# Patient Record
Sex: Female | Born: 2000 | Race: White | Hispanic: No | Marital: Single | Smoking: Never smoker
Health system: Southern US, Community
[De-identification: ages and names within clinical notes are randomized; demographics above are authoritative.]

## PROBLEM LIST (undated history)

## (undated) DIAGNOSIS — R55 Syncope and collapse: Secondary | ICD-10-CM

## (undated) DIAGNOSIS — K5281 Eosinophilic gastritis or gastroenteritis: Secondary | ICD-10-CM

## (undated) DIAGNOSIS — B9681 Helicobacter pylori [H. pylori] as the cause of diseases classified elsewhere: Secondary | ICD-10-CM

## (undated) HISTORY — DX: Syncope and collapse: R55

---
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
## 2020-01-01 ENCOUNTER — Ambulatory Visit: Payer: PRIVATE HEALTH INSURANCE

## 2020-01-09 ENCOUNTER — Ambulatory Visit: Payer: PRIVATE HEALTH INSURANCE

## 2020-04-22 ENCOUNTER — Ambulatory Visit
Admission: RE | Admit: 2020-04-22 | Discharge: 2020-04-22 | Disposition: A | Discharge status: Home / Self Care | Payer: PRIVATE HEALTH INSURANCE | Source: Ambulatory Visit | Attending: Pediatrics | Admitting: Pediatrics

## 2020-04-22 ENCOUNTER — Other Ambulatory Visit: Payer: Self-pay | Admitting: Pediatrics

## 2020-04-22 DIAGNOSIS — S99911A Unspecified injury of right ankle, initial encounter: Secondary | ICD-10-CM

## 2020-04-27 IMAGING — CR DG CHEST 2V
1 series · 2 of 2 positions shown · non-contrast
Comparison: 12/13/2010

CLINICAL DATA: Chest pain

EXAM:
CHEST - 2 VIEW

[Series 1: dg chest 2 view · 0.14mm/px · 2 of 2 slices shown]
[im 1/2]
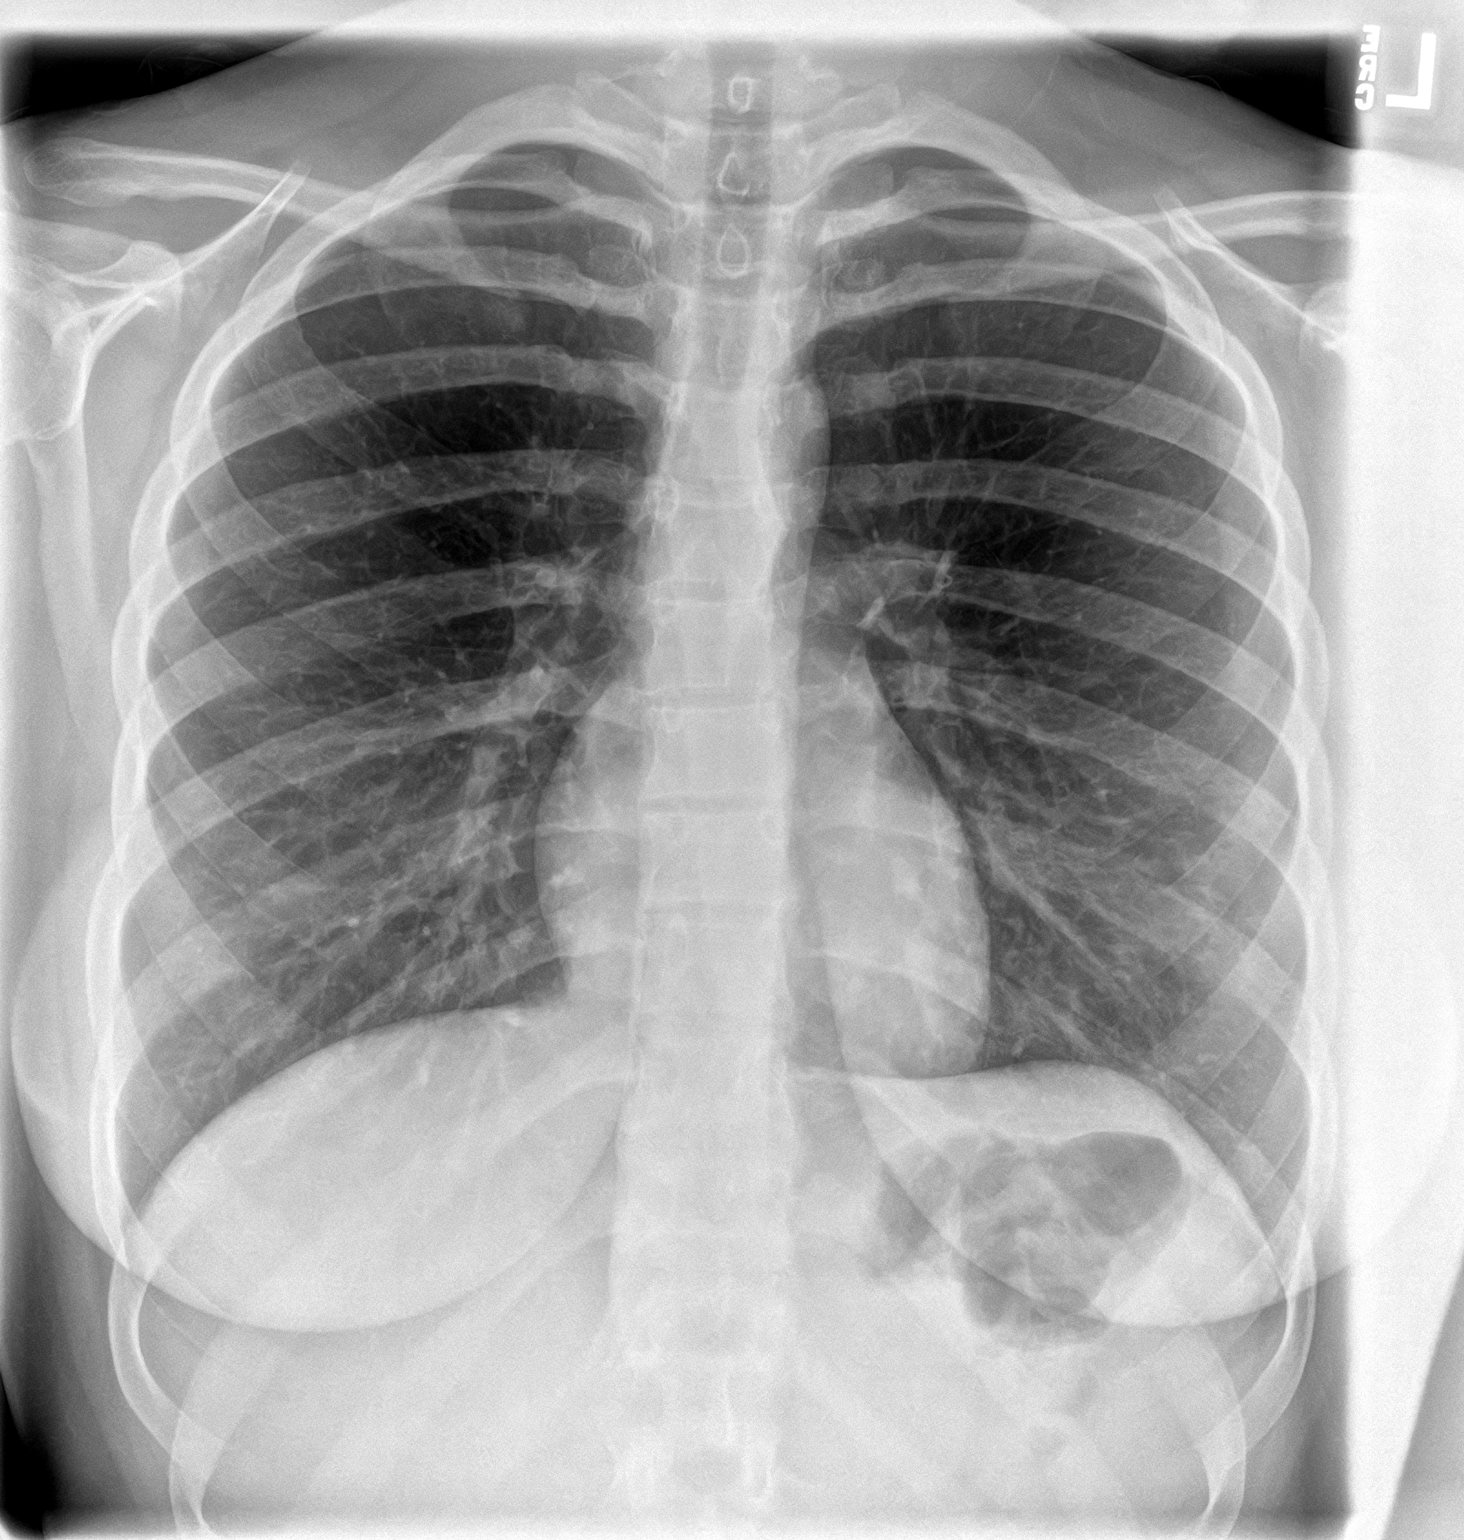
[im 2/2]
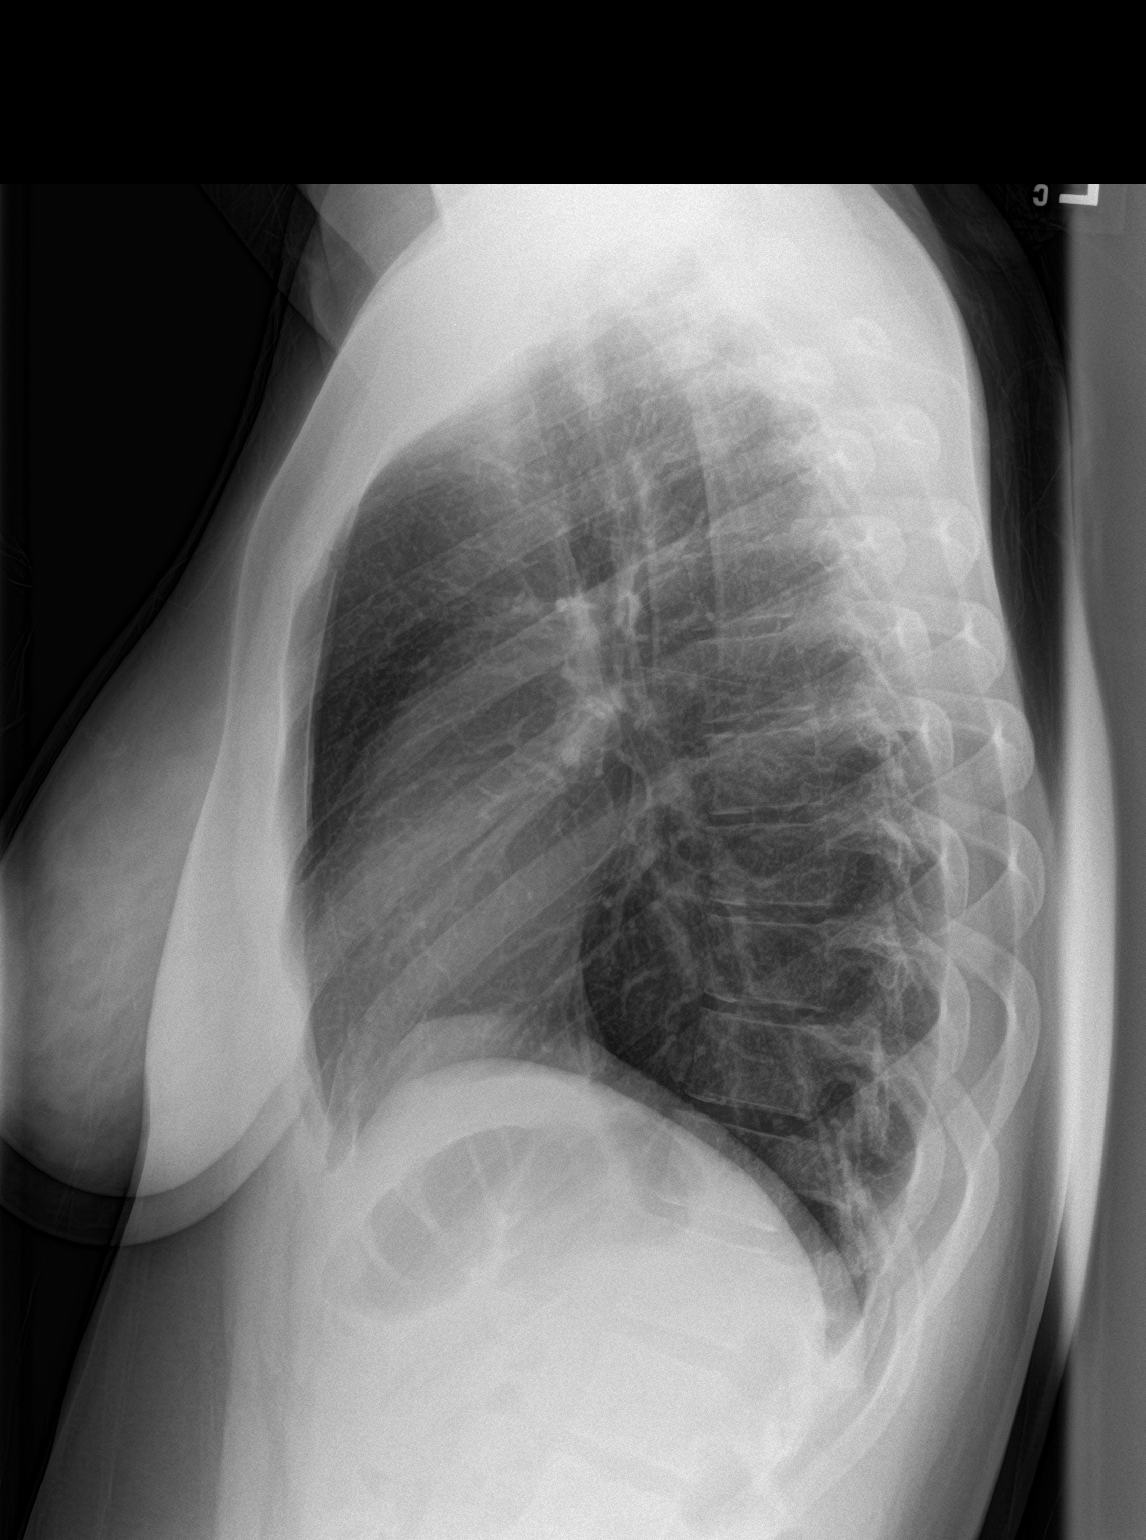

[2 of 2 positions shown; findings below may reference images not displayed]

FINDINGS: The heart size and mediastinal contours are within normal limits.
Both lungs are clear. The visualized skeletal structures are
unremarkable.
IMPRESSION: No active cardiopulmonary disease.

## 2020-09-13 NOTE — Telephone Encounter (Signed)
A user error has taken place: encounter opened in error, closed for administrative reasons.

## 2020-12-11 ENCOUNTER — Other Ambulatory Visit: Payer: Self-pay

## 2020-12-11 DIAGNOSIS — U071 COVID-19: Secondary | ICD-10-CM | POA: Insufficient documentation

## 2020-12-11 DIAGNOSIS — N390 Urinary tract infection, site not specified: Secondary | ICD-10-CM | POA: Insufficient documentation

## 2020-12-11 DIAGNOSIS — E869 Volume depletion, unspecified: Secondary | ICD-10-CM | POA: Insufficient documentation

## 2020-12-11 DIAGNOSIS — Z28310 Unvaccinated for covid-19: Secondary | ICD-10-CM | POA: Insufficient documentation

## 2020-12-11 DIAGNOSIS — B9689 Other specified bacterial agents as the cause of diseases classified elsewhere: Secondary | ICD-10-CM | POA: Insufficient documentation

## 2020-12-11 LAB — BASIC METABOLIC PANEL
Anion gap: 7 (ref 5–15)
BUN: 8 mg/dL (ref 6–20)
CO2: 29 mmol/L (ref 22–32)
Calcium: 9.2 mg/dL (ref 8.9–10.3)
Chloride: 103 mmol/L (ref 98–111)
Creatinine, Ser: 0.76 mg/dL (ref 0.44–1.00)
GFR, Estimated: >60 mL/min (ref >60)
Glucose, Bld: 70 mg/dL (ref 70–99)
Potassium: 3.6 mmol/L (ref 3.5–5.1)
Sodium: 139 mmol/L (ref 135–145)

## 2020-12-11 LAB — URINALYSIS, COMPLETE (UACMP) WITH MICROSCOPIC
Bilirubin Urine: NEGATIVE
Glucose, UA: NEGATIVE mg/dL
Hgb urine dipstick: NEGATIVE
Ketones, ur: NEGATIVE mg/dL
Nitrite: NEGATIVE
Protein, ur: NEGATIVE mg/dL
Specific Gravity, Urine: 1.017 (ref 1.005–1.030)
pH: 8 (ref 5.0–8.0)

## 2020-12-11 LAB — CBC
HCT: 39.6 % (ref 36.0–46.0)
Hemoglobin: 13.7 g/dL (ref 12.0–15.0)
MCH: 30.2 pg (ref 26.0–34.0)
MCHC: 34.6 g/dL (ref 30.0–36.0)
MCV: 87.2 fL (ref 80.0–100.0)
Platelets: 319 10*3/uL (ref 150–400)
RBC: 4.54 MIL/uL (ref 3.87–5.11)
RDW: 12 % (ref 11.5–15.5)
WBC: 5 10*3/uL (ref 4.0–10.5)
nRBC: 0 % (ref 0.0–0.2)

## 2020-12-11 LAB — POC URINE PREG, ED: Preg Test, Ur: NEGATIVE

## 2020-12-11 NOTE — ED Triage Notes (Signed)
Pt to ER via POV with complaints of generalized weakness. Reports she has been sick over the last week with nausea/ vomiting and has been unable to keep anything down. Reports a 7-8lbs weight loss this week. Today she reports that she felt like she was going to pass out. Reports still feeling lightheaded.   Denies abdominal pain. No urinary symptoms.

## 2020-12-12 ENCOUNTER — Emergency Department
Admission: EM | Admit: 2020-12-12 | Discharge: 2020-12-12 | Disposition: A | Discharge status: Home / Self Care | Payer: PRIVATE HEALTH INSURANCE | Attending: Emergency Medicine | Admitting: Emergency Medicine

## 2020-12-12 DIAGNOSIS — U071 COVID-19: Secondary | ICD-10-CM

## 2020-12-12 DIAGNOSIS — R112 Nausea with vomiting, unspecified: Secondary | ICD-10-CM

## 2020-12-12 DIAGNOSIS — E869 Volume depletion, unspecified: Secondary | ICD-10-CM

## 2020-12-12 DIAGNOSIS — N390 Urinary tract infection, site not specified: Secondary | ICD-10-CM

## 2020-12-12 LAB — RESP PANEL BY RT-PCR (FLU A&B, COVID) ARPGX2
Influenza A by PCR: NEGATIVE
Influenza B by PCR: NEGATIVE
SARS Coronavirus 2 by RT PCR: POSITIVE — AB

## 2020-12-12 MED ORDER — ONDANSETRON HCL 4 MG/2ML IJ SOLN
4.0000 mg | INTRAMUSCULAR | Status: AC
  Administered 2020-12-12: 4 mg via INTRAVENOUS
  Filled 2020-12-12: qty 2

## 2020-12-12 MED ORDER — ONDANSETRON 4 MG PO TBDP: ORAL_TABLET | ORAL | 0 refills | Status: AC

## 2020-12-12 MED ORDER — LACTATED RINGERS IV BOLUS
1000.0000 mL | Freq: Once | INTRAVENOUS | Status: AC
  Administered 2020-12-12: 1000 mL via INTRAVENOUS

## 2020-12-12 MED ORDER — FOSFOMYCIN TROMETHAMINE 3 G PO PACK
3.0000 g | PACK | Freq: Once | ORAL | Status: AC
  Administered 2020-12-12: 3 g via ORAL
  Filled 2020-12-12: qty 3

## 2020-12-12 NOTE — ED Provider Notes (Signed)
[facility] Emergency Department Provider Note  ____________________________________________   Event Date/Time   First MD Initiated Contact with Patient 12/12/20 0250     (approximate)  I have reviewed the triage vital signs and the nursing notes.   HISTORY  Chief Complaint Weakness    HPI Nicole Glass is a 20 y.o. female with history as listed below which notably includes eosinophilic gastroenteritis and peptic ulcer surgery disease with H. pylori.  She presents for about a week of general malaise, several episodes of nausea and vomiting, decreased appetite.  The nausea and vomiting has improved over the last couple of days but she still does not have much of an appetite.  She denies abdominal pain.  She reports that she has lost some weight this week, perhaps as much is 7 or 8 pounds.  By today she sometimes feels lightheaded like she might pass out when she stands up and moves around.  She describes the symptoms as severe at times and nothing in particular makes them better, standing up and moving around makes them worse.  She reports that she is not vaccinated against COVID-19.  She denies headache, sore throat, chest pain, shortness of breath, cough, abdominal pain, and dysuria.      Past Medical History:  Diagnosis Date   Eosinophilic gastroenteritis    Peptic ulcer associated with Helicobacter pylori infection     There are no problems to display for this patient.   History reviewed. No pertinent surgical history.  Prior to Admission medications   Medication Sig Start Date End Date Taking? Authorizing Provider  ondansetron (ZOFRAN ODT) 4 MG disintegrating tablet Allow 1-2 tablets to dissolve in your mouth every 8 hours as needed for nausea/vomiting 12/12/20  Yes Loleta Rose, MD  omeprazole (PRILOSEC OTC) 20 MG tablet Take 1 tablet (20 mg total) by mouth daily. 05/19/18 05/19/19  Loleta Rose, MD  promethazine (PHENERGAN) 12.5 MG tablet Take 1  tablet (12.5 mg total) by mouth every 6 (six) hours as needed for nausea or vomiting. 05/22/18   Willy Eddy, MD  sucralfate (CARAFATE) 1 g tablet Take 1 tablet (1 g total) by mouth 4 (four) times daily as needed (for abdominal discomfort, nausea, and/or vomiting). 05/19/18   Loleta Rose, MD    Allergies Augmentin [amoxicillin-pot clavulanate] and Omnicef [cefdinir]  No family history on file.  Social History Social History   Tobacco Use   Smoking status: Never   Smokeless tobacco: Never  Substance Use Topics   Alcohol use: Never   Drug use: Never    Review of Systems Constitutional: No fever/chills.  General malaise and fatigue. Eyes: No visual changes. ENT: No sore throat. Cardiovascular: Denies chest pain. Respiratory: Denies shortness of breath. Gastrointestinal: Multiple episodes of nausea and vomiting recently but not for the last couple of days.  Decreased appetite.  No abdominal pain. Genitourinary: Negative for dysuria. Musculoskeletal: Negative for neck pain.  Negative for back pain. Integumentary: Negative for rash. Neurological: Negative for headaches, focal weakness or numbness.   ____________________________________________   PHYSICAL EXAM:  VITAL SIGNS: ED Triage Vitals  Enc Vitals Group     BP 12/11/20 1827 111/78     Pulse Rate 12/11/20 1827 99     Resp 12/11/20 1827 18     Temp 12/11/20 1827 98 F (36.7 C)     Temp Source 12/11/20 1827 Oral     SpO2 12/11/20 1827 100 %     Weight 12/11/20 1856 59 kg (130 lb)  Height 12/11/20 1856 1.651 m (5\' 5" )     Head Circumference --      Peak Flow --      Pain Score 12/11/20 1856 0     Pain Loc --      Pain Edu? --      Excl. in GC? --     Constitutional: Alert and oriented.  Eyes: Conjunctivae are normal.  Head: Atraumatic. Nose: No congestion/rhinnorhea. Mouth/Throat: Patient is wearing a mask. Neck: No stridor.  No meningeal signs.   Cardiovascular: Normal rate, regular rhythm. Good  peripheral circulation. Respiratory: Normal respiratory effort.  No retractions. Gastrointestinal: Soft and nontender. No distention.  Musculoskeletal: No lower extremity tenderness nor edema. No gross deformities of extremities. Neurologic:  Normal speech and language. No gross focal neurologic deficits are appreciated.  Skin:  Skin is warm, dry and intact. Psychiatric: Mood and affect are normal. Speech and behavior are normal.  ____________________________________________   LABS (all labs ordered are listed, but only abnormal results are displayed)  Labs Reviewed  RESP PANEL BY RT-PCR (FLU A&B, COVID) ARPGX2 - Abnormal; Notable for the following components:      Result Value   SARS Coronavirus 2 by RT PCR POSITIVE (*)    All other components within normal limits  URINALYSIS, COMPLETE (UACMP) WITH MICROSCOPIC - Abnormal; Notable for the following components:   Color, Urine YELLOW (*)    APPearance CLOUDY (*)    Leukocytes,Ua LARGE (*)    Bacteria, UA RARE (*)    All other components within normal limits  BASIC METABOLIC PANEL  CBC  POC URINE PREG, ED  POC URINE PREG, ED  CBG MONITORING, ED   ____________________________________________   INITIAL IMPRESSION / MDM / ASSESSMENT AND PLAN / ED COURSE  As part of my medical decision making, I reviewed the following data within the electronic MEDICAL RECORD NUMBER History obtained from family, Nursing notes reviewed and incorporated, Labs reviewed , Old chart reviewed, and Notes from prior ED visits   Differential diagnosis includes, but is not limited to, nonspecific viral infection, COVID-19, SBO/ileus, nonspecific gastritis, anemia, peptic ulcer disease.  Patient's vital signs are stable and within normal limits with no tachycardia and normal blood pressure.  She has not had any nausea or vomiting recently but has had decreased appetite.  No tenderness to palpation of the abdomen.  Labs are notable for some WBCs in her urine in the  absence of dysuria.  Basic metabolic panel is normal, urine pregnancy test is negative, CBC is normal with no anemia and no leukocytosis.  The absence of abdominal pain and tenderness points away from peptic ulcer disease though nonspecific gastritis is still possible.  Fortunately there is no evidence of an emergent medical condition at this time.  The patient denies dysuria but does have some WBCs and large leukocytes.  I will treat with a one-time dose of fosfomycin as this could theoretically be causing her to be ill and I will send a urine culture for future reference.  She is not on vaccinated for COVID-19 and at the patient and mother's request I am sending a respiratory viral swab.  We will provide Zofran 4 mg IV, lactated Ringer's 1 L IV bolus, and encourage p.o. fluids.  I anticipate discharge and outpatient follow-up.     Clinical Course as of 12/12/20 0455  Thu Dec 12, 2020  0452 SARS Coronavirus 2 by RT PCR(!): POSITIVE Patient is COVID-positive which is consistent with her symptoms.  We will treat  with fosfomycin as previously described for questionable UTI and I updated the patient and her mother about the results.  I gave my usual and customary management recommendations and return precautions and they understand and agree with the plan. [CF]  [phone] Of note, given that the patient has had symptoms for about a week, she is not a candidate for paxlovid. [CF]    Clinical Course User Index [CF] Loleta Rose, MD     ____________________________________________  FINAL CLINICAL IMPRESSION(S) / ED DIAGNOSES  Final diagnoses:  COVID-19  Urinary tract infection without hematuria, site unspecified  Volume depletion  Non-intractable vomiting with nausea, unspecified vomiting type     MEDICATIONS GIVEN DURING THIS VISIT:  Medications  fosfomycin (MONUROL) packet 3 g (has no administration in time range)  ondansetron (ZOFRAN) injection 4 mg (4 mg Intravenous Given 12/12/20 0324)   lactated ringers bolus 1,000 mL (1,000 mLs Intravenous New Bag/Given 12/12/20 0324)     ED Discharge Orders          Ordered    ondansetron (ZOFRAN ODT) 4 MG disintegrating tablet        12/12/20 0453             Note:  This document was prepared using Dragon voice recognition software and may include unintentional dictation errors.   Loleta Rose, MD 12/12/20 [phone]

## 2020-12-12 NOTE — Discharge Instructions (Signed)
As we discussed, we believe your symptoms are caused by COVID-19 because you tested positive today.  The rest of your work-up was reassuring.  Please read through the included information.  Use the prescribed nausea medicine if needed but it is important that he stay hydrated.  Follow-up with your regular doctor.  Return to the emergency department if you develop new or worsening symptoms that concern you.

## 2021-06-18 ENCOUNTER — Emergency Department
Admission: EM | Admit: 2021-06-18 | Discharge: 2021-06-18 | Disposition: A | Discharge status: Home / Self Care | Payer: Medicaid Other | Attending: Emergency Medicine | Admitting: Emergency Medicine

## 2021-06-18 DIAGNOSIS — R55 Syncope and collapse: Secondary | ICD-10-CM | POA: Insufficient documentation

## 2021-06-18 LAB — URINE DRUG SCREEN, QUALITATIVE (ARMC ONLY)
Amphetamines, Ur Screen: NOT DETECTED
Barbiturates, Ur Screen: NOT DETECTED
Benzodiazepine, Ur Scrn: NOT DETECTED
Cannabinoid 50 Ng, Ur ~~LOC~~: NOT DETECTED
Cocaine Metabolite,Ur ~~LOC~~: NOT DETECTED
MDMA (Ecstasy)Ur Screen: NOT DETECTED
Methadone Scn, Ur: NOT DETECTED
Opiate, Ur Screen: NOT DETECTED
Phencyclidine (PCP) Ur S: NOT DETECTED
Tricyclic, Ur Screen: NOT DETECTED

## 2021-06-18 LAB — D-DIMER, QUANTITATIVE: D-Dimer, Quant: <0.27 ug/mL-FEU (ref 0.00–0.50)

## 2021-06-18 LAB — TROPONIN I (HIGH SENSITIVITY): Troponin I (High Sensitivity): <2 ng/L (ref <18)

## 2021-06-18 LAB — BASIC METABOLIC PANEL
Anion gap: 7 (ref 5–15)
BUN: 12 mg/dL (ref 6–20)
CO2: 26 mmol/L (ref 22–32)
Calcium: 9.2 mg/dL (ref 8.9–10.3)
Chloride: 102 mmol/L (ref 98–111)
Creatinine, Ser: 0.62 mg/dL (ref 0.44–1.00)
GFR, Estimated: >60 mL/min (ref >60)
Glucose, Bld: 93 mg/dL (ref 70–99)
Potassium: 3.3 mmol/L — ABNORMAL LOW (ref 3.5–5.1)
Sodium: 135 mmol/L (ref 135–145)

## 2021-06-18 LAB — CBC
HCT: 38.2 % (ref 36.0–46.0)
Hemoglobin: 12.5 g/dL (ref 12.0–15.0)
MCH: 29.3 pg (ref 26.0–34.0)
MCHC: 32.7 g/dL (ref 30.0–36.0)
MCV: 89.5 fL (ref 80.0–100.0)
Platelets: 316 10*3/uL (ref 150–400)
RBC: 4.27 MIL/uL (ref 3.87–5.11)
RDW: 12.8 % (ref 11.5–15.5)
WBC: 8.3 10*3/uL (ref 4.0–10.5)
nRBC: 0 % (ref 0.0–0.2)

## 2021-06-18 LAB — PREGNANCY, URINE: Preg Test, Ur: NEGATIVE

## 2021-06-18 LAB — CBG MONITORING, ED: Glucose-Capillary: 100 mg/dL — ABNORMAL HIGH (ref 70–99)

## 2021-06-18 LAB — ETHANOL: Alcohol, Ethyl (B): <10 mg/dL (ref <10)

## 2021-06-18 NOTE — ED Provider Notes (Signed)
[facility] Provider Note   Event Date/Time   First MD Initiated Contact with Patient 06/18/21 1459     (approximate)  History   Near Syncope  HPI Nicole Glass is a 21 y.o. female with a past medical history of eosinophilic gastroenteritis who presents for reported syncope.  Patient states that she has had multiple syncopal events since presenting to the emergency department today as well as 4 within the past few days.  Patient describes these episodes as feeling very lightheaded when not being able to open her eyes however she denies fully losing consciousness.  Patient states that she is aware of everything when she has these episodes and denies any previous symptoms similar to this.  Patient denies any chest pain, fluttering in the chest, shortness of breath.  Patient does endorse headache and a warm sensation.     Physical Exam   Triage Vital Signs: ED Triage Vitals  Enc Vitals Group     BP 06/18/21 1405 126/85     Pulse Rate 06/18/21 1405 99     Resp 06/18/21 1418 20     Temp 06/18/21 1418 98.7 F (37.1 C)     Temp src --      SpO2 06/18/21 1405 100 %     Weight --      Height 06/18/21 1413 5\' 5"  (1.651 m)     Head Circumference --      Peak Flow --      Pain Score 06/18/21 1411 0     Pain Loc --      Pain Edu? --      Excl. in GC? --     Most recent vital signs: Vitals:   06/18/21 1730 06/18/21 1745  BP:    Pulse: 94 (!) 103  Resp: 19 19  Temp:    SpO2: 100% 100%    General: Awake, oriented x4 CV:  Good peripheral perfusion.  Resp:  Normal effort.  Abd:  No distention.  Other:  Young Caucasian female laying in bed in no distress   ED Results / Procedures / Treatments   Labs (all labs ordered are listed, but only abnormal results are displayed) Labs Reviewed  BASIC METABOLIC PANEL - Abnormal; Notable for the following components:      Result Value   Potassium 3.3 (*)    All other components within normal limits  CBG  MONITORING, ED - Abnormal; Notable for the following components:   Glucose-Capillary 100 (*)    All other components within normal limits  URINE DRUG SCREEN, QUALITATIVE ([facility] ONLY)  CBC  ETHANOL  D-DIMER, QUANTITATIVE  PREGNANCY, URINE  POC URINE PREG, ED  TROPONIN I (HIGH SENSITIVITY)  TROPONIN I (HIGH SENSITIVITY)     EKG ED ECG REPORT I, 08/16/21, the attending physician, personally viewed and interpreted this ECG.  Date: 06/18/2021 EKG Time: 1409 Rate: 90 Rhythm: normal sinus rhythm QRS Axis: normal Intervals: normal ST/T Wave abnormalities: normal Narrative Interpretation: no evidence of acute ischemia  PROCEDURES:  Critical Care performed: No  .1-3 Lead EKG Interpretation Performed by: 08/16/2021, MD Authorized by: Merwyn Katos, MD     Interpretation: normal     ECG rate:  95   ECG rate assessment: normal     Rhythm: sinus rhythm     Ectopy: none     Conduction: normal    MEDICATIONS ORDERED IN ED: Medications - No data to display  IMPRESSION / MDM /  ASSESSMENT AND PLAN / ED COURSE  I reviewed the triage vital signs and the nursing notes.                             The patient is on the cardiac monitor to evaluate for evidence of arrhythmia and/or significant heart rate changes. Patient presents with complaints of syncope/presyncope ED Workup:  CBC, BMP, Troponin, ECG, urine pregnancy, UDS Differential diagnosis includes HF, ICH, seizure, stroke, HOCM, ACS, aortic dissection, malignant arrhythmia, or GI bleed. Findings: No evidence of acute laboratory abnormalities.  Troponin negative x1 EKG: No e/o STEMI. No evidence of Brugadas sign, delta wave, epsilon wave, significantly prolonged QTc, or malignant arrhythmia. CBC showed no evidence of acute abnormalities BMP only showed slight hypokalemia to 3.3 and patient was encouraged to increase potassium containing foods. Disposition: Discharge. Patient is at baseline at this time. Return  precautions expressed and understood in person. Advised follow up with primary care provider or clinic physician in next 24 hours.      FINAL CLINICAL IMPRESSION(S) / ED DIAGNOSES   Final diagnoses:  Syncope, unspecified syncope type     Rx / DC Orders   ED Discharge Orders     None        Note:  This document was prepared using Dragon voice recognition software and may include unintentional dictation errors.   Merwyn Katos, MD 06/18/21 [phone]

## 2021-06-18 NOTE — Discharge Instructions (Addendum)
Please follow-up with cardiology for further evaluation of of these episodes of loss of consciousness

## 2021-06-18 NOTE — ED Triage Notes (Signed)
Pt to ED POV with mother for near syncope that started within past hour. Mother states told her she didn't feel good then started acting sleepy.  Pt responsive to verbal stimuli. Keeps laying head down and closing eyes. States felt nauseous and had headache earlier. RR even and unlabored.  Denies pregnancy. Denies drug or ETOH use.

## 2021-06-18 NOTE — ED Notes (Signed)
RN talked with MD about pt having what looks like seizure activity in her eyes and pt comes un responsive. RN was able to apply pressure to pt toe nail beds and pt pulled her foot back in response. Pt hand was also above her face and was re directed to her chest by the pt. Pt vital signs are WNL during this episode and pt is A/Ox4 after the episode is finished. Pt parents are disputing the decision of their daughter being d/c home. RN notified MD.

## 2021-06-18 NOTE — ED Notes (Signed)
Patient sitting up in wheelchair, texting on phone. NAD  AAOx3  skin warm and dry. NAD

## 2021-06-24 ENCOUNTER — Ambulatory Visit: Payer: Medicaid Other | Admitting: Cardiology

## 2022-02-25 ENCOUNTER — Emergency Department
Admission: EM | Admit: 2022-02-25 | Discharge: 2022-02-25 | Disposition: A | Discharge status: Home / Self Care | Payer: Medicaid Other

## 2022-02-25 NOTE — ED Triage Notes (Signed)
First Nurse Note;  Pt via EMS from home. Pt took first dose of azithromycin. Pt x/o SOB and states she became up responsive per mother. Per EMS, pt is slow to answer but A&Ox4 and NAD  VSS

## 2022-03-30 IMAGING — CR DG ANKLE COMPLETE 3+V*R*
1 series · 3 of 3 positions shown · non-contrast
Comparison: None.

CLINICAL DATA: Ankle injury

EXAM:
RIGHT ANKLE - COMPLETE 3+ VIEW

[Series 1: dg ankle complete right · 0.14mm/px · 3 of 3 slices shown]
[im 1/3]
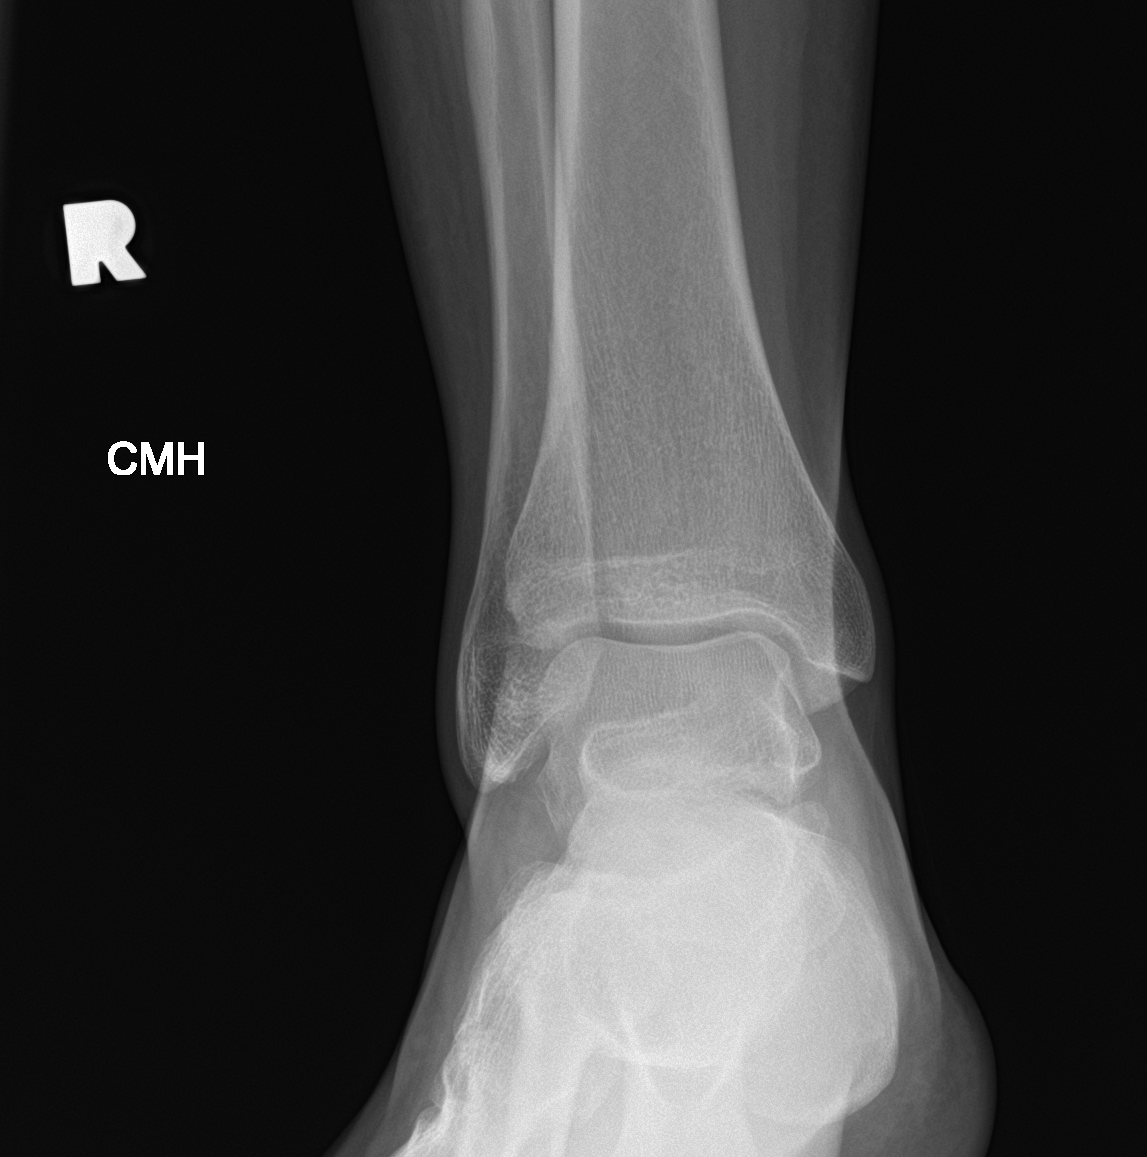
[im 2/3]
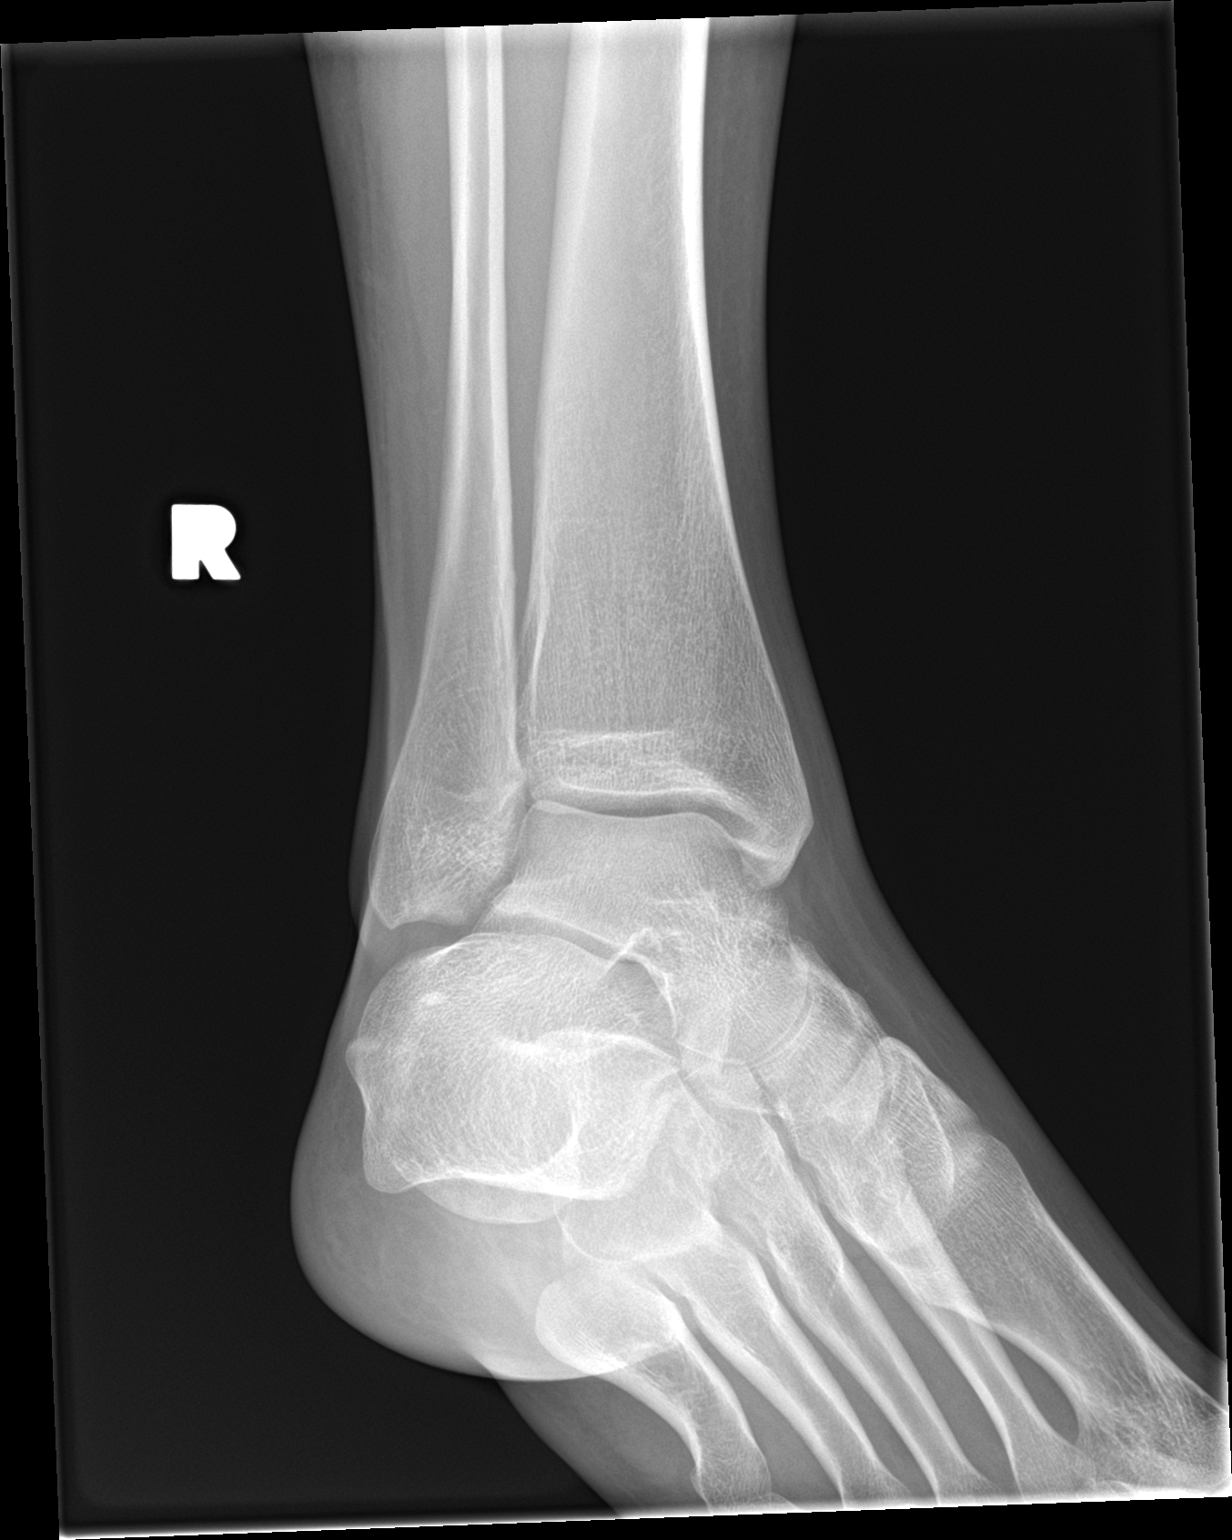
[im 3/3]
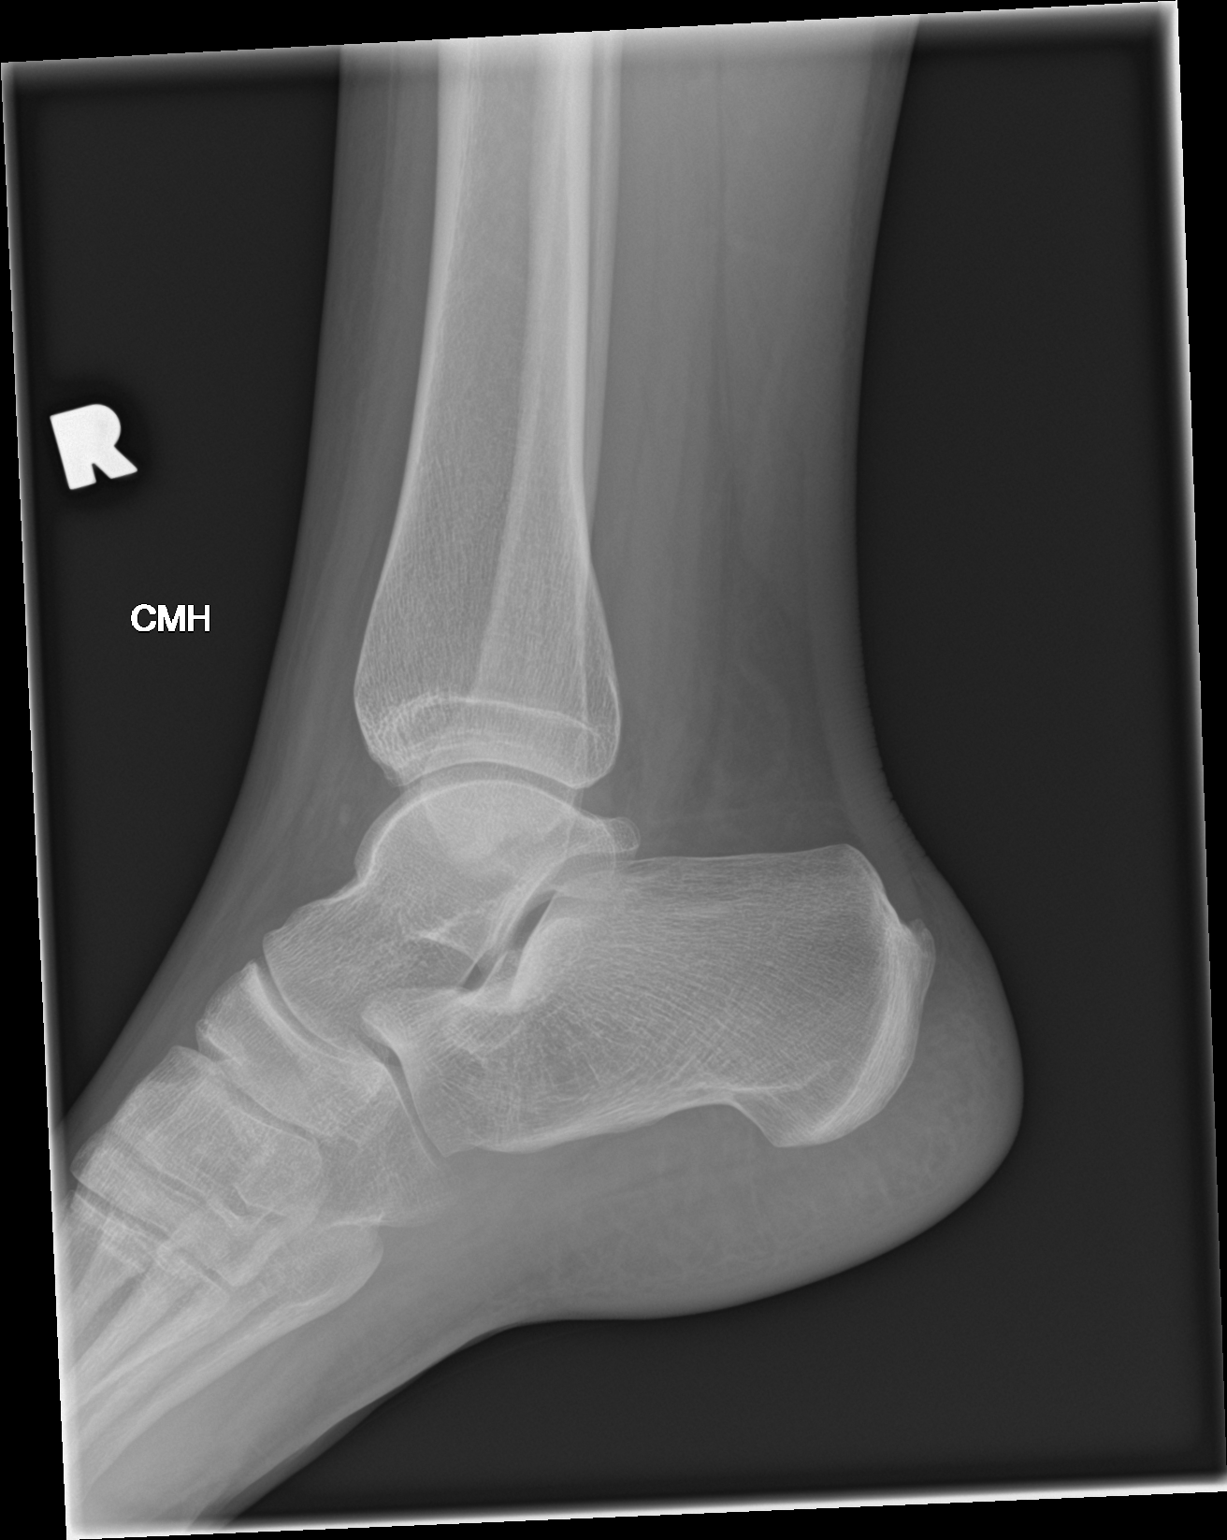

[3 of 3 positions shown; findings below may reference images not displayed]

FINDINGS: There is no evidence of fracture, dislocation, or joint effusion.
There is no evidence of arthropathy or other focal bone abnormality.
Soft tissues are unremarkable.
IMPRESSION: Negative.

## 2022-06-11 ENCOUNTER — Encounter (INDEPENDENT_AMBULATORY_CARE_PROVIDER_SITE_OTHER): Payer: Self-pay
